# Patient Record
Sex: Female | Born: 1995 | Race: White | Marital: Single | Smoking: Never smoker
Health system: Southern US, Community
[De-identification: ages and names within clinical notes are randomized; demographics above are authoritative.]

---

## 2020-03-19 ENCOUNTER — Other Ambulatory Visit: Payer: Self-pay | Admitting: Unknown Physician Specialty

## 2020-03-19 DIAGNOSIS — H93A1 Pulsatile tinnitus, right ear: Secondary | ICD-10-CM

## 2020-03-22 ENCOUNTER — Ambulatory Visit
Admission: RE | Admit: 2020-03-22 | Discharge: 2020-03-22 | Disposition: A | Discharge status: Home / Self Care | Payer: BLUE CROSS/BLUE SHIELD | Source: Ambulatory Visit | Attending: Unknown Physician Specialty | Admitting: Unknown Physician Specialty

## 2020-03-22 ENCOUNTER — Other Ambulatory Visit: Payer: Self-pay

## 2020-03-22 ENCOUNTER — Other Ambulatory Visit: Payer: Self-pay | Admitting: Unknown Physician Specialty

## 2020-03-22 DIAGNOSIS — H93A1 Pulsatile tinnitus, right ear: Secondary | ICD-10-CM | POA: Insufficient documentation

## 2021-03-24 ENCOUNTER — Encounter: Payer: Self-pay | Admitting: Emergency Medicine

## 2021-03-24 ENCOUNTER — Ambulatory Visit
Admission: EM | Admit: 2021-03-24 | Discharge: 2021-03-24 | Disposition: A | Discharge status: Home / Self Care | Payer: Managed Care, Other (non HMO)

## 2021-03-24 ENCOUNTER — Other Ambulatory Visit: Payer: Self-pay

## 2021-03-24 DIAGNOSIS — H6123 Impacted cerumen, bilateral: Secondary | ICD-10-CM

## 2021-03-24 NOTE — ED Provider Notes (Signed)
Renaldo Fiddler    CSN: [id number] Arrival date & time: 03/24/21  6073      History   Chief Complaint Chief Complaint  Patient presents with   Cerumen Impaction    Left ear     HPI Victoria Powell is a 25 y.o. female for evaluation of her left ear cerumen impaction.  She has noticed loss of hearing in the left ear, states she has been using Q-tips has been trying to remove wax with some irrigation and wax softener but has been unsuccessful.  She denies any fevers.  No drainage.  No significant pain.  HPI  History reviewed. No pertinent past medical history.  There are no problems to display for this patient.   History reviewed. No pertinent surgical history.  OB History   No obstetric history on file.      Home Medications    Prior to Admission medications   Medication Sig Start Date End Date Taking? Authorizing Provider  SPRINTEC 28 0.25-35 MG-MCG tablet Take 1 tablet by mouth daily. 12/27/20  Yes [provider]    Family History History reviewed. No pertinent family history.  Social History Social History   Tobacco Use   Smoking status: Never   Smokeless tobacco: Never  Vaping Use   Vaping Use: Never used  Substance Use Topics   Alcohol use: Not Currently   Drug use: Never     Allergies   Patient has no known allergies.   Review of Systems Review of Systems  Constitutional:  Negative for chills and fever.  HENT:  Positive for ear pain. Negative for ear discharge, sinus pressure and sinus pain.   Respiratory:  Negative for shortness of breath.   Cardiovascular:  Negative for chest pain.    Physical Exam Triage Vital Signs ED Triage Vitals  Enc Vitals Group     BP 03/24/21 0902 126/84     Pulse Rate 03/24/21 0902 76     Resp --      Temp 03/24/21 0902 98.8 F (37.1 C)     Temp Source 03/24/21 0902 Oral     SpO2 03/24/21 0902 98 %     Weight --      Height --      Head Circumference --      Peak Flow --      Pain Score  03/24/21 0903 0     Pain Loc --      Pain Edu? --      Excl. in GC? --    No data found.  Updated Vital Signs BP 126/84 (BP Location: Left Arm)   Pulse 76   Temp 98.8 F (37.1 C) (Oral)   LMP 03/05/2021   SpO2 98%   Visual Acuity Right Eye Distance:   Left Eye Distance:   Bilateral Distance:    Right Eye Near:   Left Eye Near:    Bilateral Near:     Physical Exam Constitutional:      Appearance: She is well-developed.  HENT:     Head: Normocephalic and atraumatic.     Comments: Bilateral canals with cerumen impaction, right canal with 80% blockage in left canal of 100% blockage.  Wax is soft.  No signs of irritation or infection of the external ear. Eyes:     Conjunctiva/sclera: Conjunctivae normal.  Cardiovascular:     Rate and Rhythm: Normal rate.  Pulmonary:     Effort: Pulmonary effort is normal. No respiratory distress.  Musculoskeletal:  General: Normal range of motion.     Cervical back: Normal range of motion.  Skin:    General: Skin is warm.     Findings: No rash.  Neurological:     Mental Status: She is alert and oriented to person, place, and time.  Psychiatric:        Behavior: Behavior normal.        Thought Content: Thought content normal.     UC Treatments / Results  Labs (all labs ordered are listed, but only abnormal results are displayed) Labs Reviewed - No data to display  EKG   Radiology No results found.  Procedures Ear Cerumen Removal  Date/Time: 03/24/2021 9:12 AM Performed by: Evon Slack, PA-C Authorized by: Evon Slack, PA-C   Consent:    Consent obtained:  Verbal   Consent given by:  Patient Universal protocol:    Patient identity confirmed:  Verbally with patient Procedure details:    Location:  L ear and R ear   Procedure type: irrigation     Procedure outcomes: cerumen removed   Post-procedure details:    Inspection:  Ear canal clear   Hearing quality:  Improved   Procedure completion:   Tolerated well, no immediate complications (including critical care time)  Medications Ordered in UC Medications - No data to display  Initial Impression / Assessment and Plan / UC Course  I have reviewed the triage vital signs and the nursing notes.  Pertinent labs & imaging results that were available during my care of the patient were reviewed by me and considered in my medical decision making (see chart for details).     25 year old female with bilateral cerumen impaction, left greater than right.  She needs cerumen disimpaction evaluation.  She is educated on daily ear canal cleansing at home.  Symptoms and signs symptoms return to urgent care for. Final Clinical Impressions(s) / UC Diagnoses   Final diagnoses:  Bilateral impacted cerumen   Discharge Instructions   None    ED Prescriptions   None    PDMP not reviewed this encounter.   Evon Slack, [place] 03/24/21 [phone]

## 2021-03-24 NOTE — ED Triage Notes (Signed)
Pt presents to clinic for left ear impaction.

## 2021-05-06 IMAGING — MR MR HEAD W/O CM
9 of 10 series · 40 of 48 positions shown · non-contrast
Comparison: None.

CLINICAL DATA: Pulsatile tinnitus.

EXAM:
MRI HEAD WITHOUT CONTRAST
MRA HEAD WITHOUT CONTRAST
TECHNIQUE: Multiplanar, multiecho pulse sequences of the brain and surrounding
structures were obtained without intravenous contrast. Angiographic
images of the head were obtained using MRA technique without
contrast (the patient refused contrast).

[Series 5: T1 · sagittal · 5.0mm · 0.62mm/px · 4 of 24 slices shown (1 of 2)]
[im 1/24]
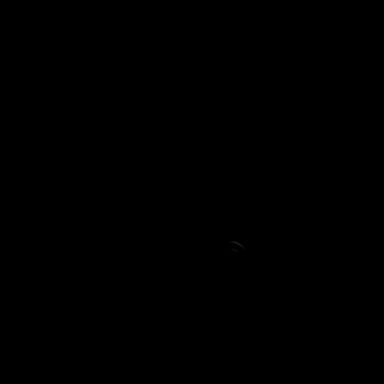
[im 8/24]
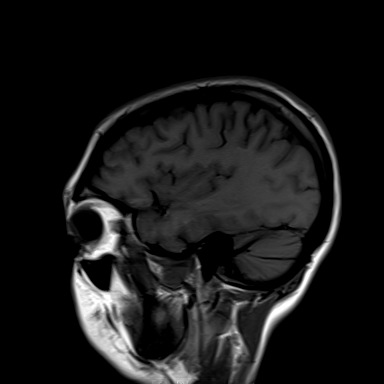
[im 16/24]
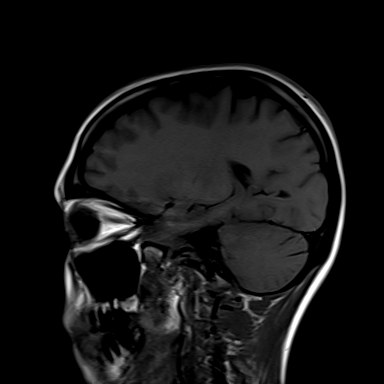
[im 24/24]
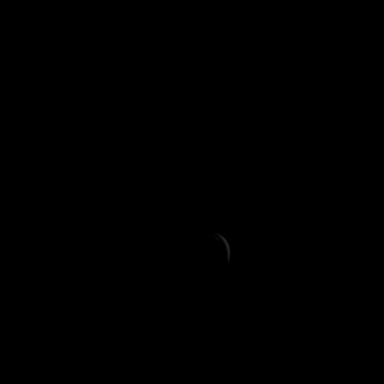

[Series 10: T2 · axial · 5.0mm · 0.45mm/px · z∈[-105,+51]mm · 3 of 27 slices shown (1 of 2)]
[im 1/27]
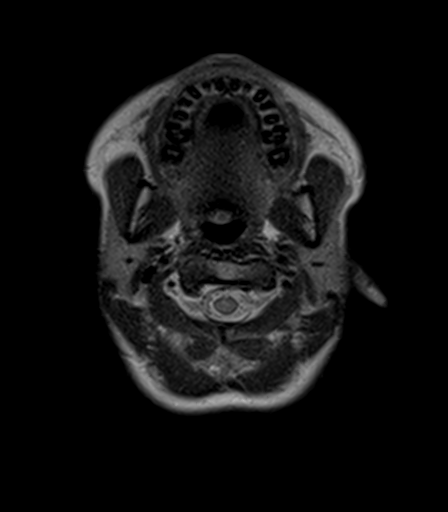
[im 14/27]
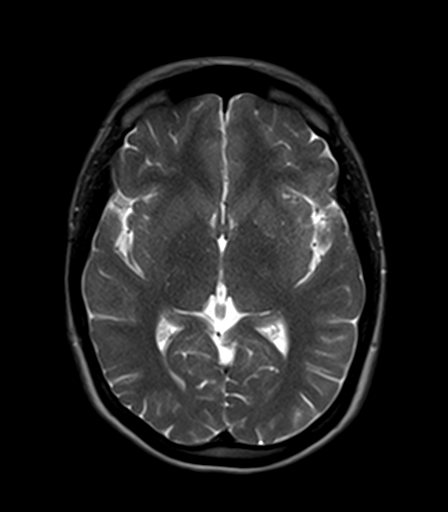
[im 27/27]
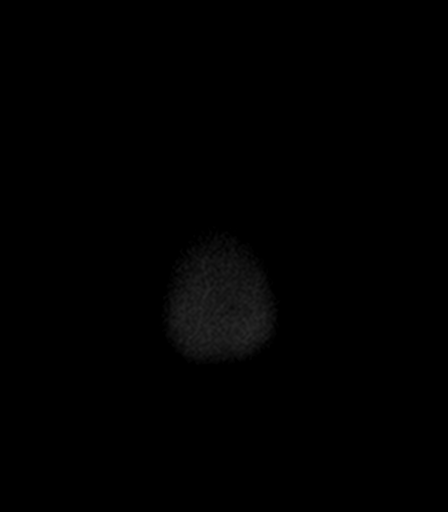

[Series 11: ax dwi_tracew · axial · 3.0mm · 0.71mm/px · z∈[-111,+53]mm · 7 of 56 slices shown]
[im 1/56]
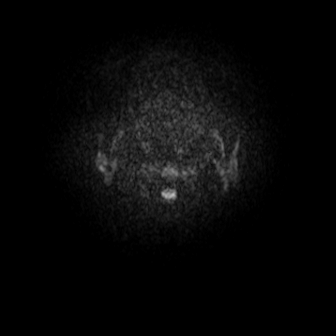
[im 10/56]
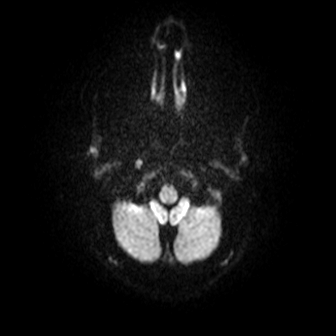
[im 19/56]
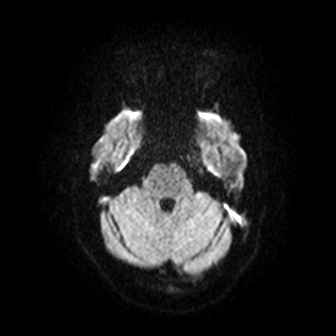
[im 28/56]
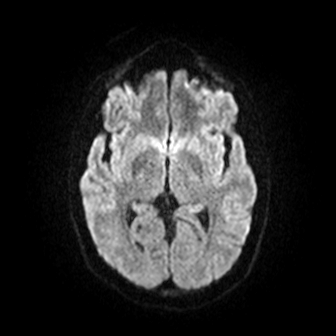
[im 37/56]
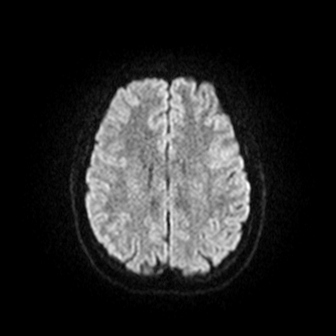
[im 46/56]
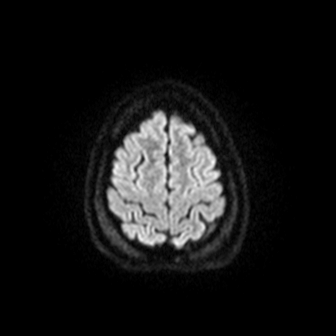
[im 56/56]
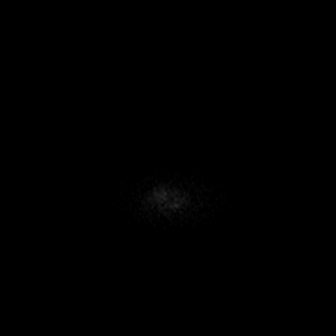

[Series 12: ax dwi_adc · axial · 3.0mm · 0.71mm/px · z∈[-111,+53]mm · 7 of 56 slices shown]
[im 1/56]
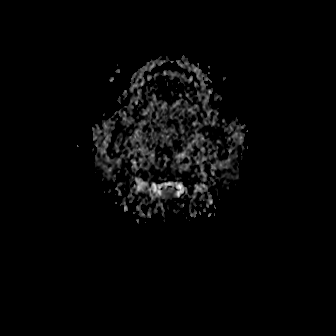
[im 10/56]
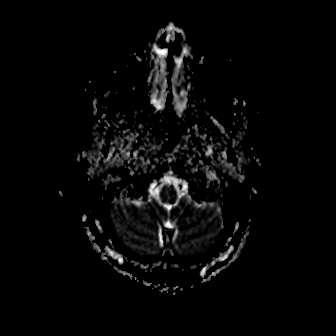
[im 19/56]
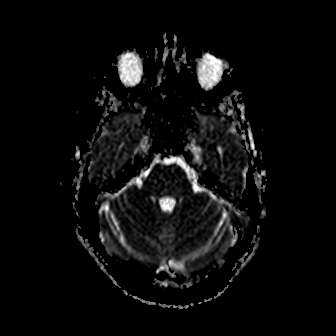
[im 28/56]
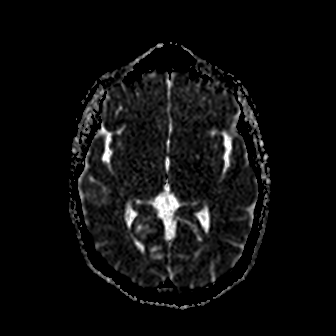
[im 37/56]
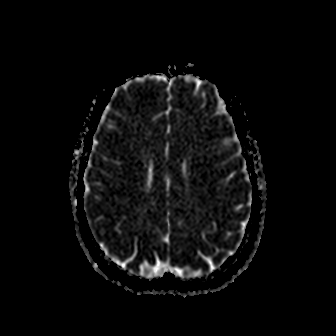
[im 46/56]
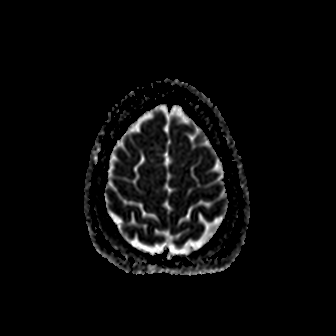
[im 56/56]
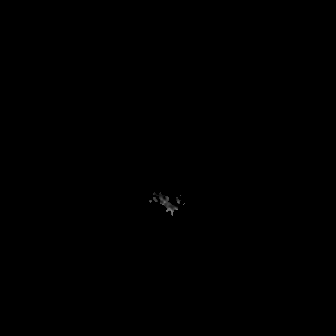

[Series 13: FLAIR · axial · 3.0mm · 1.20mm/px · z∈[-108,+53]mm · 7 of 55 slices shown]
[im 1/55]
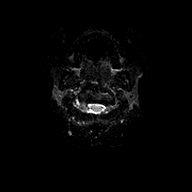
[im 10/55]
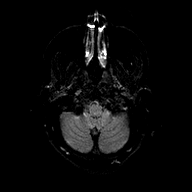
[im 19/55]
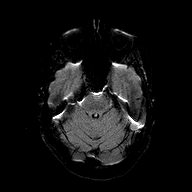
[im 28/55]
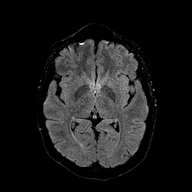
[im 37/55]
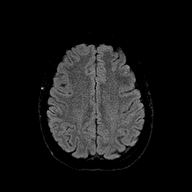
[im 46/55]
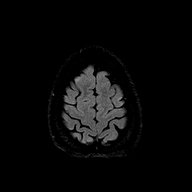
[im 55/55]
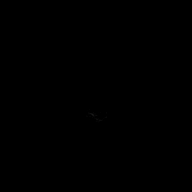

[Series 14: T1 · axial · 5.0mm · 0.90mm/px · z∈[-97,+58]mm · 3 of 27 slices shown (2 of 2)]
[im 1/27]
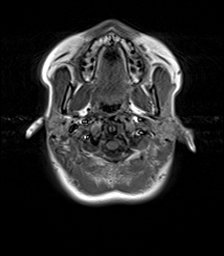
[im 14/27]
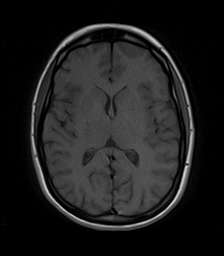
[im 27/27]
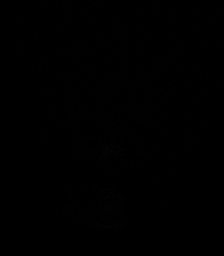

[Series 15: T2-star · axial · 5.0mm · 0.45mm/px · z∈[-105,+51]mm · 3 of 27 slices shown]
[im 1/27]
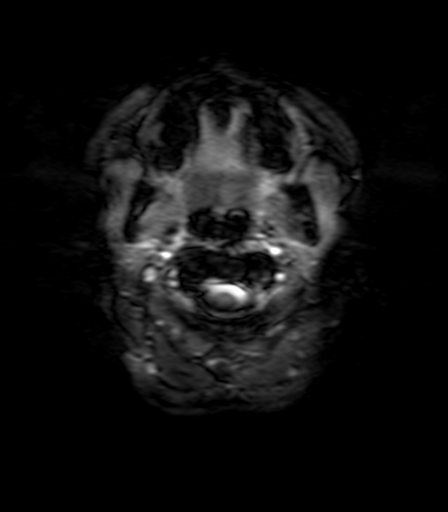
[im 14/27]
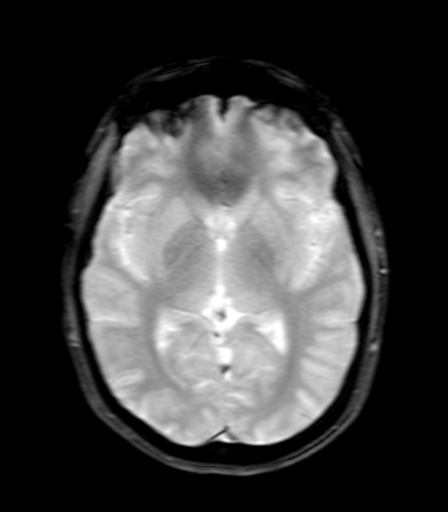
[im 27/27]
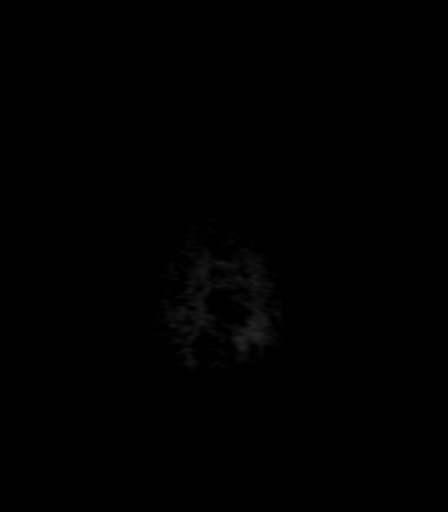

[Series 16: T2 · coronal · 5.0mm · 0.45mm/px · 4 of 31 slices shown (2 of 2)]
[im 1/31]
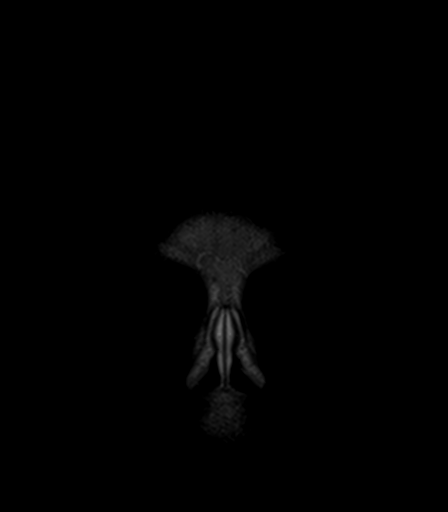
[im 11/31]
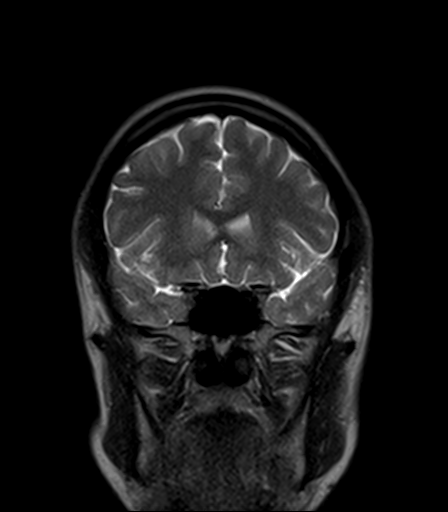
[im 21/31]
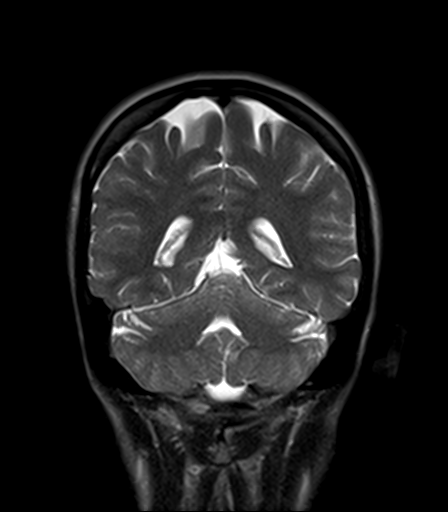
[im 31/31]
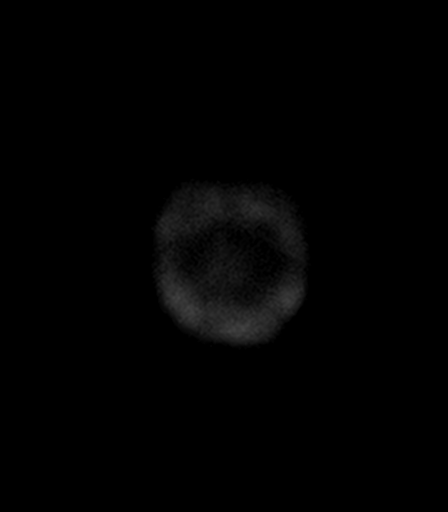

[Series 17: cor dwi_tracew · coronal · 5.0mm · 0.68mm/px · 2 of 40 slices shown]
[im 1/40]
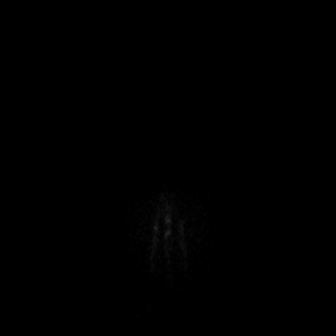
[im 10/40]
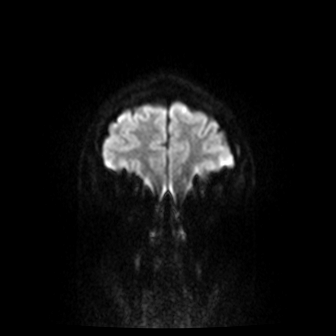

[40 of 48 positions shown; findings below may reference images not displayed]

FINDINGS: MRI HEAD FINDINGS

Brain: No acute infarction, hemorrhage, hydrocephalus, extra-axial
collection or mass lesion.

Vascular: Normal flow voids at the skull base.

Skull and upper cervical spine: Normal marrow signal.

Sinuses/Orbits: Negative.

Other: No mastoid effusions.

MRA HEAD FINDINGS

Please note that the patient refused contrast.

No evidence of significant (greater than 50%) arterial stenosis,
occlusion, aneurysm, or vascular malformation. Diminutive right A1
ACA with out focal stenosis.
IMPRESSION: 1. No acute intracranial abnormality.
2. No acute vascular abnormality. No significant stenosis or
occlusion.
